# Patient Record
Sex: Female | Born: 1935 | Race: White | Hispanic: No | Marital: Single
Health system: Southern US, Community
[De-identification: ages and names within clinical notes are randomized; demographics above are authoritative.]

## PROBLEM LIST (undated history)

## (undated) DIAGNOSIS — F70 Mild intellectual disabilities: Secondary | ICD-10-CM

## (undated) DIAGNOSIS — I10 Essential (primary) hypertension: Secondary | ICD-10-CM

## (undated) DIAGNOSIS — R569 Unspecified convulsions: Secondary | ICD-10-CM

## (undated) DIAGNOSIS — Z950 Presence of cardiac pacemaker: Secondary | ICD-10-CM

## (undated) DIAGNOSIS — I639 Cerebral infarction, unspecified: Secondary | ICD-10-CM

## (undated) DIAGNOSIS — D649 Anemia, unspecified: Secondary | ICD-10-CM

## (undated) DIAGNOSIS — F329 Major depressive disorder, single episode, unspecified: Secondary | ICD-10-CM

## (undated) DIAGNOSIS — F0151 Vascular dementia with behavioral disturbance: Secondary | ICD-10-CM

## (undated) DIAGNOSIS — K219 Gastro-esophageal reflux disease without esophagitis: Secondary | ICD-10-CM

## (undated) DIAGNOSIS — F0391 Unspecified dementia with behavioral disturbance: Secondary | ICD-10-CM

## (undated) DIAGNOSIS — E559 Vitamin D deficiency, unspecified: Secondary | ICD-10-CM

## (undated) DIAGNOSIS — K5900 Constipation, unspecified: Secondary | ICD-10-CM

## (undated) DIAGNOSIS — M810 Age-related osteoporosis without current pathological fracture: Secondary | ICD-10-CM

## (undated) DIAGNOSIS — R488 Other symbolic dysfunctions: Secondary | ICD-10-CM

## (undated) DIAGNOSIS — I509 Heart failure, unspecified: Secondary | ICD-10-CM

## (undated) DIAGNOSIS — I2510 Atherosclerotic heart disease of native coronary artery without angina pectoris: Secondary | ICD-10-CM

## (undated) DIAGNOSIS — F01518 Vascular dementia, unspecified severity, with other behavioral disturbance: Secondary | ICD-10-CM

## (undated) DIAGNOSIS — J449 Chronic obstructive pulmonary disease, unspecified: Secondary | ICD-10-CM

## (undated) DIAGNOSIS — R1310 Dysphagia, unspecified: Secondary | ICD-10-CM

## (undated) DIAGNOSIS — G8323 Monoplegia of upper limb affecting right nondominant side: Secondary | ICD-10-CM

## (undated) DIAGNOSIS — D500 Iron deficiency anemia secondary to blood loss (chronic): Secondary | ICD-10-CM

## (undated) DIAGNOSIS — G4700 Insomnia, unspecified: Secondary | ICD-10-CM

## (undated) DIAGNOSIS — H409 Unspecified glaucoma: Secondary | ICD-10-CM

## (undated) DIAGNOSIS — I4891 Unspecified atrial fibrillation: Secondary | ICD-10-CM

## (undated) DIAGNOSIS — I495 Sick sinus syndrome: Secondary | ICD-10-CM

## (undated) DIAGNOSIS — R609 Edema, unspecified: Secondary | ICD-10-CM

## (undated) DIAGNOSIS — F03918 Unspecified dementia, unspecified severity, with other behavioral disturbance: Secondary | ICD-10-CM

## (undated) DIAGNOSIS — G894 Chronic pain syndrome: Secondary | ICD-10-CM

## (undated) DIAGNOSIS — I213 ST elevation (STEMI) myocardial infarction of unspecified site: Secondary | ICD-10-CM

## (undated) DIAGNOSIS — J189 Pneumonia, unspecified organism: Secondary | ICD-10-CM

## (undated) DIAGNOSIS — E440 Moderate protein-calorie malnutrition: Secondary | ICD-10-CM

## (undated) DIAGNOSIS — F32A Depression, unspecified: Secondary | ICD-10-CM

## (undated) HISTORY — PX: CHOLECYSTECTOMY: SHX55

---

## 2007-12-17 ENCOUNTER — Inpatient Hospital Stay (HOSPITAL_COMMUNITY): Admission: EM | Admit: 2007-12-17 | Discharge: 2007-12-21 | Payer: Self-pay | Admitting: Emergency Medicine

## 2007-12-17 ENCOUNTER — Ambulatory Visit: Payer: Self-pay | Admitting: Internal Medicine

## 2007-12-18 ENCOUNTER — Encounter (INDEPENDENT_AMBULATORY_CARE_PROVIDER_SITE_OTHER): Payer: Self-pay | Admitting: Family Medicine

## 2007-12-25 ENCOUNTER — Emergency Department (HOSPITAL_COMMUNITY): Admission: EM | Admit: 2007-12-25 | Discharge: 2007-12-25 | Payer: Self-pay | Admitting: Emergency Medicine

## 2011-02-08 NOTE — Discharge Summary (Signed)
NAMECORIANN, BROUHARD             ACCOUNT NO.:  [id number]   MEDICAL RECORD NO.:  [id number]          PATIENT TYPE:  INP   LOCATION:  A210                          FACILITY:  [facility]   PHYSICIAN:  Osvaldo Shipper, MD     DATE OF BIRTH:  [birth year]   DATE OF ADMISSION:  12/17/2007  DATE OF DISCHARGE:  03/27/2009LH                               DISCHARGE SUMMARY   Please review H&P dictated by Dr. Lilian Kapur for details regarding the  patient's presenting illness.   DISCHARGE DIAGNOSES:  1. Bradycardia secondary to her medications, resolved.  2. Poorly controlled hypertension.  3. History of aortic stenosis, dyslipidemia all stable.   BRIEF HOSPITAL COURSE:  Briefly, this 77 year old Caucasian female who  lives in a rest home/group home, who presented to the ED because of slow  heart rate.  She was apparently found on the floor and had a syncopal  episode.  She was found to have a pulse of 20.  She was given atropine  by EMS and she responded to the 40-50s.  The patient became more awake  and alert.  She was brought into the ED where she was evaluated and it  was decided to admit her.  The patient was noted to be on metoprolol and  clonidine which was thought to be the reason for her significant  bradycardia.  She was taken off of these.  Her heart rate slowly  responded and then she went into sinus tachycardia.  Her heart rate now  is running between 75-100 sinus rhythm.  However, her blood pressure has  come up a little bit.  With the re-initiation of metoprolol this has  improved.  Hopefully with re-initiation of some of her home blood  pressure pills, her blood pressure will continue to improve.  One of the  reasons for her high blood pressure could be rebound hypertension from  clonidine.  Cardiology was consulted who recommended discontinuing the  clonidine for now.   Her other medical issues pretty much remained stable.  The patient had  some nausea as well during this admission.   She was given symptomatic  treatment.  X-rays of the abdomen were done which showed possible mild  ileus.  But since then, she has been tolerating a diet and she has had a  couple of bowel movements.  Hence, I do not think she needs further  evaluation at this point.   She also had other studies including a renal ultrasound which showed  incomplete visualization of the left kidney, incidental gallstones were  noted.  No hydronephrosis was seen.  Chest x-rays showed cardiac  enlargement with possible mild edema.  Repeat x-ray did not show this  finding.   On the day of discharge, the patient is feeling much better.  She has  ambulated.  She feels that she can go back home.  Telemetry shows heart  rate in the 100-110 range all through the night.  She was in the 70s.  Her blood pressure is 174/83 this morning.  Overnight, it was 138/73.  Saturation 97% on room air.  Examination otherwise was unremarkable.  She does have a slight elevated blood pressure, but I think she does not  have any symptoms associated, so I think she can be discharged.  We will  readjust some of her medications as will be stated below.   DISCHARGE MEDICATIONS:  1. Norvasc 5 mg p.o. b.i.d.  2. Benazepril 40 mg b.i.d.  3. Metoprolol 100 mg p.o. b.i.d.  4. HCTZ 25 mg once daily.  5. Vitamin D 400 units 1-1/2 tablets every day.  6. Acetaminophen 500 mg b.i.d.  7. Actonel 35 mg once a week.  8. Nabumetone 750 mg b.i.d.  9. Lipitor 40 mg at bedtime.  10.Ranitidine 150 mg b.i.d.  11.Paroxetine 10 mg every day.  12.Calcium carbonate 600 mg b.i.d.  13.Carbamazepine 200 mg 4 times a day.  14.Fish oil 1 gm every day.  15.Doxazosin 4 mg at bedtime.  16.Tramadol 50 mg every 4 hours as needed for pain.  17.Loperamide 2 mg 3 times daily as needed for diarrhea.  18.Stool softener 100 mg b.i.d. as needed for constipation.   DISCHARGE INSTRUCTIONS:  1. She will need to follow up with her PMD, I believe is located in       [facility] in 1 weeks' time, the group home to please      arrange this.  2. She will need a basic metabolic profile blood work in 1 weeks' time      as well to look at her kidney function.  3. I will encourage the group home to check her blood pressure on a      daily basis and call her PMD if it is staying elevated.  4. I have instructed the patient to consume fluids generously for the      next 1 week.  5. Her labs from today did show elevated BUN and creatinine, but she      is making urine, so I am not overly concerned about this.  Repeat      BMET in a week's time should be enough evaluation and follow up for      this at this point.  6. Imaging studies have been discussed above.   CONSULTATIONS:  Obtained from [facility].      Osvaldo Shipper, MD  Electronically Signed     GK/MEDQ  D:  12/21/2007  T:  12/21/2007  Job:  [id number]

## 2011-02-08 NOTE — Consult Note (Signed)
[name], [name]             ACCOUNT NO.:  [id number]   MEDICAL RECORD NO.:  [id number]          PATIENT TYPE:  INP   LOCATION:  A210                          FACILITY:  [facility]   PHYSICIAN:  Pricilla Riffle, MD, FACCDATE OF BIRTH:  21-May-1931   DATE OF CONSULTATION:  12/17/2007  DATE OF DISCHARGE:                                 CONSULTATION   IDENTIFICATION:  The patient is a 77 year old we are asked to see for  evaluation of bradycardia.   HISTORY OF PRESENT ILLNESS:  The patient lives in a family resident home  per her report and some of the ER note.  Yesterday she complained of  abdominal pain that was periumbilical.  She went to bed.  She says she  took the covers on and off.  Felt hot at times. Woke up. got up this  morning actually, and again her abdominal pain came back.  She went back  to bed.  Had maybe some vomiting, got up, felt like a seizure was going  to happen.  Next thing she could not balance, and she was on the floor.  She thinks she might have passed out for a minute but very unclear.  Denied any associated palpitations.  EMS found her as they were called  by other residents.  Her heart rate was noted to be 20 at the time.  One  amp of atropine was given, and her heart rate increased.  She was  transported to [facility] ER.  She denies chest pain.  Breathing is  fair.   ALLERGIES:  None.   MEDICATIONS ON ADMISSION:  1. Actonel 35.  2. Clonidine 0.3 b.i.d.  3. Nabumetone 750 b.i.d.  4. Benazepril 40 b.i.d.  5. Lipitor 40 q.h.s.  6. Ranitidine 150 b.i.d.  7. Paroxetine 10.  8. Metoprolol tartrate 200.  9. Hydrochlorothiazide 25.  10.Calcium carbonate 1200.  11.Carbamazepine 200.  12.Felodipine 10.  13.Darvocet p.r.n.  14.Doxazosin 4 q.h.s.  15.Tramadol.   PAST MEDICAL HISTORY:  1. Hypertension.  2. 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  3. History of aortic stenosis.  4. Dyslipidemia.  5. Question  seizures.  6. History of breast cancer status post mastectomy.   SOCIAL HISTORY:  Does not smoke, does not drink.  Lives in family home.   FAMILY HISTORY:  Significant for hypertension.   REVIEW OF SYSTEMS:  History of UTI, history of occasional diarrhea,  constipation, history of fevers, no chills, history of nonproductive  cough.  Otherwise, all systems reviewed and negative to the above  problem except as noted above.   PHYSICAL EXAMINATION:  GENERAL:  The patient is in no acute distress at  present.  VITAL SIGNS:  Blood pressure 140-189 over 72-89, pulses in the 40's to  50's, sinus bradycardia, O2 sat on room air 99%, respiratory rate 18,  temperature 98.  HEENT:  Normocephalic, atraumatic.  EOMI.  PERRL.  Slight lateral  nystagmus in the right, poor dentition.  NECK:  No JVD.  No bruits.  No thyromegaly.  LUNGS:  Clear without rales or wheezes.  SKIN:  Shows multiple nevi on the back.  CARDIAC:  Regular rate and rhythm. S1, S2.  Grade 2/6 systolic murmur  heard best at the base.  ABDOMEN:  Supple, nontender.  Normal bowel sounds.  No masses.  EXTREMITIES:  Right foot inverted, right arm paralyzed.  2+ pulses, no  edema.  NEUROLOGICAL:  Unable to move right arm, left leg is weak.  Cranial  nerves II-XII grossly intact.  Otherwise, deferred.   LABORATORY DATA:  Labs significant for hemoglobin 11, WBC 11.1.  Troponin less than 0.05, MB less than 1.  BUN and creatinine of 19 and  1, potassium 3.6.  CA of 8.9.  BNP 3324.   Chest x-ray shows cardiomegaly, vascular congestion.  This was a  portable film.  A 12-lead EKG shows sinus rhythm 77 beats per minute,  incomplete right bundle branch block, slight ST depression  inferolaterally.  Telemetry as noted above.   IMPRESSION:  The patient is a 77 year old with hypertension found on  floor today, heart rate 20.  Responded to atropine.  EKG with  inferolateral ST changes.  Currently blood pressure is normal to high.  Heart rate  is in the 40's to 50's.  Exam shows no evidence of congestive  heart failure, murmur consistent with AS on exam, but does not appear to  be severe.  Abdomen is supple and nontender.  Note paralysis in the  right arm.   Note the patient is on significant negative chronotropes preadmission  with clonidine and metoprolol; not sure how long this has been like  this.   RECOMMENDATIONS:  Would continue telemetry.  Hold metoprolol and  clonidine and follow.  Change benazepril to 40 daily.  She is on 80 by  report b.i.d.  Add Maxzide and Norvasc to regimen.  Check CK-MB and  troponin and TSH.  Echocardiogram to evaluate LV and aortic valve.  Would get records from [facility].  Would follow.      Pricilla Riffle, MD, [facility]  Electronically Signed    PVR/MEDQ  D:  12/17/2007  T:  12/18/2007  Job:  [id number]

## 2011-02-08 NOTE — H&P (Signed)
[name], [name]             ACCOUNT NO.:  [id number]   MEDICAL RECORD NO.:  [id number]          PATIENT TYPE:  INP   LOCATION:  A210                          FACILITY:  [facility]   PHYSICIAN:  Skeet Latch, DO    DATE OF BIRTH:  09/07/1931   DATE OF ADMISSION:  12/17/2007  DATE OF DISCHARGE:  LH                              HISTORY & PHYSICAL   CHIEF COMPLAINT:  Bradycardia.   HISTORY OF PRESENT ILLNESS:  This is a 77 year old Caucasian female who  was apparently found on the floor after a non-witnessed apparently fall.  EMS was called.  Apparently the patient was found to have a pulse of 20.  She was given atropine and responded to the 40-50s.  The patient states  that she felt that her stomach felt queasy and she slid down to the  floor.  The patient does not admit to any previous episode like this  before.  The patient does state that she is hot at times and was having  some belly discomfort.  The patient states that she got up and felt like  she was going to have a seizure, and next thing she knew she was on the  floor.  That is where EMS found her with heart rate of 20, and she was  given atropine.   PAST MEDICAL HISTORY:  Includes hypertension, aortic stenosis.  Hyperlipidemia.   FAMILY HISTORY:  Hypertension.   SURGICAL:  Cholecystectomy.   SOCIAL HISTORY:  No history of smoking, alcohol, illicit drug use.   No known drug allergies.   HOME MEDICATIONS:  1. Vitamin D 400 units 1-1/2 tablet every day.  2. Acetaminophen 500 mg 2 twice a day.  3. Actonel 35 mg weekly.  4. Clonidine 0.3 mg twice a day.  5. N-Bumetone 750 mg once a day.  6. Benazepril 40 mg once a day.  7. Lipitor 40 mg once a day.  8. Ranitidine 150 mg twice a day.  9. Prozac 10 mg once a day.  10.Metoprolol 100 mg 2 tablets in the morning 1 at night.  11.Hydrochlorothiazide 25 mg daily.  12.Calcium carbonate 600 mg 2 every day.  13.Carbamazepine 200 mg 4 times a day.  14.Fish or 1 gram every  day.  15.Doxazosin 4 mg 1 at bedtime.  16.Tramadol/acetaminophen 37.5/325 mg 4 times daily.  17.Propoxyphene-N 100 one tablet twice a day p.r.n. pain.  18.Loperamide 2 mg 3 times a day as needed for diarrhea.  19.Stool softener 100 mg 1 tablet twice a day as needed for      constipation.   REVIEW OF SYSTEMS:  Constitutional:  Positive for weakness and weight  loss, appetite, fever or chills.  HEENT: Unremarkable.  Cardiovascular:  Bradycardia.  No chest pain.  Respiratory: No cough, dyspnea, wheezing.  GI:  Slight nausea and no vomiting, diarrhea.  Urinary no dysuria,  urgency, frequency.  Musculoskeletal:  No arthralgias, arthritis.   PHYSICAL EXAM:  VITAL SIGNS: Temperature is 97.6, pulse 60, respirations  18, blood pressure 152/83.  HEENT: Head is atraumatic, normocephalic.  Eyes PERRL.  EOMI.  NECK:  Soft, supple, nontender,  nondistended.  CARDIOVASCULAR:  Regular rate and rhythm.  Does have a systolic ejection  murmur noted at the apex.  LUNGS:  Clear to auscultation.  No rales, rhonchi or wheezing.  ABDOMEN: Soft, nontender, nondistended.  No rigidity or guarding.  EXTREMITIES: She has some edema noted.  No erythema.   Labs so far, blood cultures are negative.  BNP is 289, phosphorus 3.7,  magnesium 1.9, last troponin 0.06, CK-MB is 2.5, total creatinine kinase  is 52.  Sodium 135, potassium 3.0, chloride 95, CO2 is 33, glucose 97,  BUN 14, creatinine 0.91, PTT 28.  PT 13.4, INR 1.0.  White count is  8700, hemoglobin 0.4, hematocrit 32.9, platelets of 220,000.  Chest x-  ray shows cardiac enlargement with slight pulmonary vascular congestion  and questionable minimal edema.  Mild right basilar atelectasis   ASSESSMENT/PLAN:  Bradycardic episode.  Could be secondary to the  patient's metoprolol.  Will hold metoprolol at this time.  Cardiology  has been consulted and their evaluation is in progress at this time.  The patient on telemetry unit for closer monitoring.   PLAN:   For hyponatremia, resolved.  Continue to monitor closely for  this.  For anemia, will get anemia panel.  Her anemia is not severe at  this time.  Will hemoccult her stools to rule out any active bleeding.  For hypertension.  The patient will placed on home medications.  At this  time the patient is stable and I anticipate her being discharged in the  next 24 to 48 hours if she continues to improve.  The patient will  placed on DVT as well as GI prophylaxis.      Skeet Latch, DO  Electronically Signed     SM/MEDQ  D:  12/18/2007  T:  12/18/2007  Job:  [id number]

## 2011-02-08 NOTE — Group Therapy Note (Signed)
NAMEGRACEY, [name]             ACCOUNT NO.:  [id number]   MEDICAL RECORD NO.:  [id number]          PATIENT TYPE:  INP   LOCATION:  A210                          FACILITY:  [facility]   PHYSICIAN:  Skeet Latch, DO    DATE OF BIRTH:  1931/02/20   DATE OF PROCEDURE:  12/18/2007  DATE OF DISCHARGE:                                 PROGRESS NOTE   SUBJECTIVE:  Ms. Beecher is doing well.  She denies any abdominal pain  or chest pain.  She also denies any shortness of breath, nausea,  vomiting or diarrhea.  The patient is alert and talkative.  She is in no  acute distress.   OBJECTIVE:  VITAL SIGNS:  Temperature 97.6 degrees, pulse 60,  respirations 18, blood pressure 152/83.  CARDIOVASCULAR:  A regular rate and rhythm.  She has a systolic ejection  murmur at the apex.  LUNGS:  Clear to auscultation bilaterally.  No rales, rhonchi or  wheezing.  ABDOMEN:  Soft, nontender, nondistended.  EXTREMITIES:  Trace edema.   LABORATORY DATA:  Beta natriuretic peptide is 289.  Phosphorus 3.7,  magnesium 1.9, sodium 135, potassium 3, chloride 95, CO2 of 33, glucose  97, BUN 14, creatinine 0.91.  PTT 28, PT 13.4, INR 1.  White count 8700,  hemoglobin 11.4, hematocrit 32.9, platelets 220.   ASSESSMENT/PLAN:  1. Bradycardia:  The patient's metoprolol is on hold at this time.      She is being evaluated for current etiology.  2. History of congestive heart failure, questionable diastolic.  No      signs of severe failure at this time.  Will continue to monitor.  3. History of aortic stenosis.  4. History of hypertension.  Will continue her home medications.  5. History of gastroesophageal reflux disease.  Will continue her home      medications.  6. Hypokalemia:  Will replace orally and repeat.  Will continue to      monitor closely.      Skeet Latch, DO  Electronically Signed     SM/MEDQ  D:  12/18/2007  T:  12/18/2007  Job:  [id number]

## 2011-06-20 LAB — DIFFERENTIAL
Basophils Absolute: 0
Basophils Absolute: 0
Basophils Absolute: 0.1
Basophils Relative: 1
Basophils Relative: 1
Eosinophils Absolute: 0
Eosinophils Absolute: 0.1
Eosinophils Absolute: 0.1
Eosinophils Absolute: 0.1
Eosinophils Relative: 0
Eosinophils Relative: 1
Lymphocytes Relative: 18
Lymphocytes Relative: 21
Lymphocytes Relative: 9 — ABNORMAL LOW
Lymphs Abs: 1
Lymphs Abs: 1.7
Monocytes Absolute: 0.6
Monocytes Absolute: 0.9
Monocytes Relative: 9
Neutro Abs: 7.3
Neutrophils Relative %: 70
Neutrophils Relative %: 84 — ABNORMAL HIGH

## 2011-06-20 LAB — CBC
HCT: 32.9 — ABNORMAL LOW
HCT: 33 — ABNORMAL LOW
Hemoglobin: 11.4 — ABNORMAL LOW
Hemoglobin: 12.6
MCHC: 34.3
MCHC: 34.8
MCV: 91.8
MCV: 92.4
MCV: 93.2
Platelets: 204
Platelets: 220
Platelets: 242
Platelets: 270
RBC: 3.96
RDW: 13
RDW: 13
RDW: 13.3
WBC: 11.1 — ABNORMAL HIGH
WBC: 8.1

## 2011-06-20 LAB — BASIC METABOLIC PANEL
BUN: 14
BUN: 19
BUN: 21
BUN: 53 — ABNORMAL HIGH
CO2: 29
CO2: 32
Calcium: 9.5
Calcium: 9.6
Chloride: 97
Chloride: 98
Creatinine, Ser: 0.91
Creatinine, Ser: 0.95
Creatinine, Ser: 0.98
Creatinine, Ser: 1.07
GFR calc Af Amer: 49 — ABNORMAL LOW
GFR calc Af Amer: >60
GFR calc Af Amer: >60
GFR calc non Af Amer: 38 — ABNORMAL LOW
GFR calc non Af Amer: 50 — ABNORMAL LOW
GFR calc non Af Amer: 55 — ABNORMAL LOW
GFR calc non Af Amer: 58 — ABNORMAL LOW
GFR calc non Af Amer: >60
Glucose, Bld: 109 — ABNORMAL HIGH
Glucose, Bld: 141 — ABNORMAL HIGH
Glucose, Bld: 97
Potassium: 3 — ABNORMAL LOW
Potassium: 4.4
Potassium: 4.4
Potassium: 4.8
Sodium: 132 — ABNORMAL LOW

## 2011-06-20 LAB — POCT CARDIAC MARKERS
CKMB, poc: 1
Operator id: 213931
Troponin i, poc: <0.05
Troponin i, poc: <0.05

## 2011-06-20 LAB — IRON AND TIBC: Saturation Ratios: 13 — ABNORMAL LOW

## 2011-06-20 LAB — CARDIAC PANEL(CRET KIN+CKTOT+MB+TROPI)
CK, MB: 2.5
CK, MB: 3
Relative Index: INVALID
Relative Index: INVALID
Total CK: 55
Total CK: 56
Troponin I: 0.05
Troponin I: 0.06
Troponin I: 0.06

## 2011-06-20 LAB — CULTURE, BLOOD (ROUTINE X 2)
Report Status: 3292009
Report Status: 3292009

## 2011-06-20 LAB — LIPASE, BLOOD: Lipase: 45

## 2011-06-20 LAB — PROTIME-INR: INR: 1

## 2011-06-20 LAB — LIPID PANEL
Cholesterol: 148
LDL Cholesterol: 70
Total CHOL/HDL Ratio: 2.8
Triglycerides: 126
VLDL: 25

## 2011-06-20 LAB — RETICULOCYTES
RBC.: 3.56 — ABNORMAL LOW
Retic Count, Absolute: 39.2
Retic Ct Pct: 1.1

## 2011-06-20 LAB — CARBAMAZEPINE LEVEL, TOTAL: Carbamazepine Lvl: 10.4

## 2011-06-20 LAB — HEPATIC FUNCTION PANEL
Alkaline Phosphatase: 56
Indirect Bilirubin: 0.6
Total Bilirubin: 0.7
Total Protein: 7.6

## 2011-06-20 LAB — VITAMIN B12: Vitamin B-12: 450 (ref 211–911)

## 2011-06-20 LAB — B-NATRIURETIC PEPTIDE (CONVERTED LAB): Pro B Natriuretic peptide (BNP): 324 — ABNORMAL HIGH

## 2011-06-20 LAB — MAGNESIUM: Magnesium: 1.9

## 2011-06-20 LAB — FOLATE: Folate: 14.3

## 2020-04-20 ENCOUNTER — Encounter (HOSPITAL_COMMUNITY): Payer: Self-pay | Admitting: Emergency Medicine

## 2020-04-20 ENCOUNTER — Other Ambulatory Visit: Payer: Self-pay

## 2020-04-20 ENCOUNTER — Emergency Department (HOSPITAL_COMMUNITY)
Admission: EM | Admit: 2020-04-20 | Discharge: 2020-04-20 | Disposition: A | Discharge status: Skilled Nursing Facility | Payer: Medicare Other | Attending: Emergency Medicine | Admitting: Emergency Medicine

## 2020-04-20 ENCOUNTER — Emergency Department (HOSPITAL_COMMUNITY): Payer: Medicare Other

## 2020-04-20 DIAGNOSIS — I2510 Atherosclerotic heart disease of native coronary artery without angina pectoris: Secondary | ICD-10-CM | POA: Insufficient documentation

## 2020-04-20 DIAGNOSIS — Z8673 Personal history of transient ischemic attack (TIA), and cerebral infarction without residual deficits: Secondary | ICD-10-CM | POA: Diagnosis not present

## 2020-04-20 DIAGNOSIS — F0391 Unspecified dementia with behavioral disturbance: Secondary | ICD-10-CM | POA: Insufficient documentation

## 2020-04-20 DIAGNOSIS — I509 Heart failure, unspecified: Secondary | ICD-10-CM | POA: Insufficient documentation

## 2020-04-20 DIAGNOSIS — I110 Hypertensive heart disease with heart failure: Secondary | ICD-10-CM | POA: Insufficient documentation

## 2020-04-20 DIAGNOSIS — I4891 Unspecified atrial fibrillation: Secondary | ICD-10-CM | POA: Diagnosis not present

## 2020-04-20 DIAGNOSIS — J690 Pneumonitis due to inhalation of food and vomit: Secondary | ICD-10-CM | POA: Insufficient documentation

## 2020-04-20 DIAGNOSIS — Z20822 Contact with and (suspected) exposure to covid-19: Secondary | ICD-10-CM | POA: Diagnosis not present

## 2020-04-20 HISTORY — DX: Iron deficiency anemia secondary to blood loss (chronic): D50.0

## 2020-04-20 HISTORY — DX: Sick sinus syndrome: I49.5

## 2020-04-20 HISTORY — DX: Anemia, unspecified: D64.9

## 2020-04-20 HISTORY — DX: Chronic obstructive pulmonary disease, unspecified: J44.9

## 2020-04-20 HISTORY — DX: Constipation, unspecified: K59.00

## 2020-04-20 HISTORY — DX: Presence of cardiac pacemaker: Z95.0

## 2020-04-20 HISTORY — DX: Unspecified convulsions: R56.9

## 2020-04-20 HISTORY — DX: Vitamin D deficiency, unspecified: E55.9

## 2020-04-20 HISTORY — DX: Vascular dementia, unspecified severity, with other behavioral disturbance: F01.518

## 2020-04-20 HISTORY — DX: Moderate protein-calorie malnutrition: E44.0

## 2020-04-20 HISTORY — DX: Edema, unspecified: R60.9

## 2020-04-20 HISTORY — DX: Major depressive disorder, single episode, unspecified: F32.9

## 2020-04-20 HISTORY — DX: Unspecified dementia with behavioral disturbance: F03.91

## 2020-04-20 HISTORY — DX: Vascular dementia with behavioral disturbance: F01.51

## 2020-04-20 HISTORY — DX: Monoplegia of upper limb affecting right nondominant side: G83.23

## 2020-04-20 HISTORY — DX: Unspecified glaucoma: H40.9

## 2020-04-20 HISTORY — DX: Gastro-esophageal reflux disease without esophagitis: K21.9

## 2020-04-20 HISTORY — DX: Essential (primary) hypertension: I10

## 2020-04-20 HISTORY — DX: Insomnia, unspecified: G47.00

## 2020-04-20 HISTORY — DX: Depression, unspecified: F32.A

## 2020-04-20 HISTORY — DX: Unspecified dementia, unspecified severity, with other behavioral disturbance: F03.918

## 2020-04-20 HISTORY — DX: Unspecified atrial fibrillation: I48.91

## 2020-04-20 HISTORY — DX: Mild intellectual disabilities: F70

## 2020-04-20 HISTORY — DX: Cerebral infarction, unspecified: I63.9

## 2020-04-20 HISTORY — DX: Age-related osteoporosis without current pathological fracture: M81.0

## 2020-04-20 HISTORY — DX: Heart failure, unspecified: I50.9

## 2020-04-20 HISTORY — DX: Other symbolic dysfunctions: R48.8

## 2020-04-20 HISTORY — DX: Atherosclerotic heart disease of native coronary artery without angina pectoris: I25.10

## 2020-04-20 HISTORY — DX: ST elevation (STEMI) myocardial infarction of unspecified site: I21.3

## 2020-04-20 HISTORY — DX: Dysphagia, unspecified: R13.10

## 2020-04-20 HISTORY — DX: Chronic pain syndrome: G89.4

## 2020-04-20 HISTORY — DX: Pneumonia, unspecified organism: J18.9

## 2020-04-20 LAB — CBC WITH DIFFERENTIAL/PLATELET
Abs Immature Granulocytes: 0.07 10*3/uL (ref 0.00–0.07)
Basophils Absolute: 0.1 10*3/uL (ref 0.0–0.1)
Basophils Relative: 1 %
Eosinophils Absolute: 0 10*3/uL (ref 0.0–0.5)
Eosinophils Relative: 0 %
HCT: 39.3 % (ref 36.0–46.0)
Hemoglobin: 11.9 g/dL — ABNORMAL LOW (ref 12.0–15.0)
Immature Granulocytes: 1 %
Lymphocytes Relative: 10 %
Lymphs Abs: 1 10*3/uL (ref 0.7–4.0)
MCH: 30 pg (ref 26.0–34.0)
MCHC: 30.3 g/dL (ref 30.0–36.0)
MCV: 99 fL (ref 80.0–100.0)
Monocytes Absolute: 0.8 10*3/uL (ref 0.1–1.0)
Monocytes Relative: 7 %
Neutro Abs: 8.3 10*3/uL — ABNORMAL HIGH (ref 1.7–7.7)
Neutrophils Relative %: 81 %
Platelets: 293 10*3/uL (ref 150–400)
RBC: 3.97 MIL/uL (ref 3.87–5.11)
RDW: 15.9 % — ABNORMAL HIGH (ref 11.5–15.5)
WBC: 10.2 10*3/uL (ref 4.0–10.5)
nRBC: 0 % (ref 0.0–0.2)

## 2020-04-20 LAB — SARS CORONAVIRUS 2 BY RT PCR (HOSPITAL ORDER, PERFORMED IN ~~LOC~~ HOSPITAL LAB): SARS Coronavirus 2: NEGATIVE

## 2020-04-20 LAB — BASIC METABOLIC PANEL
Anion gap: 11 (ref 5–15)
BUN: 23 mg/dL (ref 8–23)
CO2: 21 mmol/L — ABNORMAL LOW (ref 22–32)
Calcium: 9 mg/dL (ref 8.9–10.3)
Chloride: 100 mmol/L (ref 98–111)
Creatinine, Ser: 0.56 mg/dL (ref 0.44–1.00)
GFR calc Af Amer: >60 mL/min (ref >60)
GFR calc non Af Amer: >60 mL/min (ref >60)
Glucose, Bld: 107 mg/dL — ABNORMAL HIGH (ref 70–99)
Potassium: 4.7 mmol/L (ref 3.5–5.1)
Sodium: 132 mmol/L — ABNORMAL LOW (ref 135–145)

## 2020-04-20 MED ORDER — CLINDAMYCIN PALMITATE HCL 75 MG/5ML PO SOLR: 300.0000 mg | Freq: Three times a day (TID) | ORAL | 0 refills | Status: AC

## 2020-04-20 NOTE — ED Triage Notes (Signed)
Pt brought in from [facility] for possible aspiration from PEG tube. Per facility this has been going on for about 1 week. Pt is DNR at facility but they did not send her paperwork.

## 2020-04-20 NOTE — ED Provider Notes (Signed)
[facility] EMERGENCY DEPARTMENT Provider Note   CSN: [id number] Arrival date & time: 04/20/20  0002     History Chief Complaint  Patient presents with  . Aspiration  Level 5 caveat due to altered mental status  [name] is a 86 y.o. female.  HPI    Patient with extensive medical history including atrial fibrillation, CHF, chronic pain, COPD, dementia, previous stroke presents with concern for aspiration pneumonia.  Patient resides at a local nursing facility.  Patient has a PEG tube in place and gets continuous feeding.  She has had a previous history of aspiration pneumonia and is currently on Rocephin and clindamycin at the facility.  Tonight the patient began vomiting during a feeding, and it was concerned that she aspirated.  No other acute issues per nursing staff. Past Medical History:  Diagnosis Date  . Anemia   . Atrial fibrillation (HCC)   . CHF (congestive heart failure) (HCC)   . Chronic pain syndrome   . Constipation   . Convulsions (HCC)   . COPD (chronic obstructive pulmonary disease) (HCC)   . Coronary artery disease   . Dementia with behavioral disturbance (HCC)   . Depressive disorder   . Dysphagia   . Edema   . GERD (gastroesophageal reflux disease)   . Glaucoma   . Hypertension   . Insomnia   . Iron deficiency anemia secondary to blood loss (chronic)   . Mild intellectual disabilities   . 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 Osteoporosis   . Other symbolic dysfunctions   . Pneumonia   . Presence of cardiac pacemaker   . Protein-calorie malnutrition, moderate (HCC)   . Seizures (HCC)   . Sick sinus syndrome (HCC)   . ST elevation (STEMI) myocardial infarction (HCC)   . Stroke (HCC)   . Vascular dementia with behavioral disturbance (HCC)   . Vitamin D deficiency        OB History   No obstetric history on file.     No family history on file.  Social History   Tobacco Use  . Smoking  status: Not on file  Substance Use Topics  . Alcohol use: Not on file  . Drug use: Not on file    Home Medications Prior to Admission medications   Not on File    Allergies    Patient has no known allergies.  Review of Systems   Review of Systems  Unable to perform ROS: Mental status change    Physical Exam Updated Vital Signs BP (!) 181/93   Pulse 63   Temp 98.8 F (37.1 C) (Oral)   Resp 20   Ht 1.626 m (5\' 4" )   Wt 90.7 kg   SpO2 93%   BMI 34.33 kg/m   Physical Exam CONSTITUTIONAL: Chronically ill-appearing HEAD: Normocephalic/atraumatic EYES: EOMI ENMT: Poor dentition NECK: supple no meningeal signs CV: S1/S2 noted LUNGS: Coarse breath sounds bilaterally crackles noted, no apparent distress ABDOMEN: soft, nontender, PEG tube in place NEURO: Pt is awake/alert but nonverbal EXTREMITIES: pulses normal/equal, full ROM, edema noted. SKIN: warm, color normal PSYCH: Unable to assess  ED Results / Procedures / Treatments   Labs (all labs ordered are listed, but only abnormal results are displayed) Labs Reviewed  BASIC METABOLIC PANEL - Abnormal; Notable for the following components:      Result Value   Sodium 132 (*)    CO2 21 (*)    Glucose, Bld 107 (*)  All other components within normal limits  CBC WITH DIFFERENTIAL/PLATELET - Abnormal; Notable for the following components:   Hemoglobin 11.9 (*)    RDW 15.9 (*)    Neutro Abs 8.3 (*)    All other components within normal limits  SARS CORONAVIRUS 2 BY RT PCR (HOSPITAL ORDER, PERFORMED IN [facility])    EKG None  Radiology No results found.  Procedures Procedures Medications Ordered in ED Medications - No data to display  ED Course  I have reviewed the triage vital signs and the nursing notes.  Pertinent labs & imaging results that were available during my care of the patient were reviewed by me and considered in my medical decision making (see chart for details).    MDM  Rules/Calculators/A&P                          12:57 AM Patient presents for concern for aspiration pneumonia.  Current room air pulse ox is around 89%.  Clinically patient likely has aspiration pneumonia.  Discussed the case with nursing supervisor at the [facility].  She reports patient is supposed to stop antibiotics for aspiration today.  Patient does have a DNR in place.  Her MOST form is also enacted and is only requesting IV fluids and antibiotics for a defined period of time.  Will obtain chest x-ray and labs and reassess. 3:57 AM Pt stable No distress noted Labs reassuring Nursing staff the facility had indicated they are looking to change her tube feedings to help prevent further aspiration Orders have been given to continue clindamycin daily as well as placing her on oxygen.  Patient stable for discharge Final Clinical Impression(s) / ED Diagnoses Final diagnoses:  Aspiration pneumonia, unspecified aspiration pneumonia type, unspecified laterality, unspecified part of lung (HCC)    Rx / DC Orders ED Discharge Orders         Ordered    clindamycin (CLEOCIN) 75 MG/5ML solution  3 times daily     Discontinue  Reprint     04/20/20 0335           Zadie Rhine, MD 04/20/20 [phone]

## 2020-04-20 NOTE — Discharge Instructions (Addendum)
Patient can be continued on clindamycin 3 times a day for a week.  She will need to have a chest x-ray in 1 to 2 weeks to see if the pneumonia is improved She will need to be placed on 2 L nasal cannula to help with her oxygenation

## 2020-06-03 ENCOUNTER — Encounter (HOSPITAL_COMMUNITY): Payer: Self-pay | Admitting: Emergency Medicine

## 2020-06-03 ENCOUNTER — Other Ambulatory Visit: Payer: Self-pay

## 2020-06-03 ENCOUNTER — Emergency Department (HOSPITAL_COMMUNITY): Payer: Medicare Other

## 2020-06-03 ENCOUNTER — Emergency Department (HOSPITAL_COMMUNITY)
Admission: EM | Admit: 2020-06-03 | Discharge: 2020-06-03 | Disposition: A | Discharge status: Home / Self Care | Payer: Medicare Other | Attending: Emergency Medicine | Admitting: Emergency Medicine

## 2020-06-03 DIAGNOSIS — J449 Chronic obstructive pulmonary disease, unspecified: Secondary | ICD-10-CM | POA: Diagnosis not present

## 2020-06-03 DIAGNOSIS — I509 Heart failure, unspecified: Secondary | ICD-10-CM | POA: Diagnosis not present

## 2020-06-03 DIAGNOSIS — I110 Hypertensive heart disease with heart failure: Secondary | ICD-10-CM | POA: Diagnosis not present

## 2020-06-03 DIAGNOSIS — K9423 Gastrostomy malfunction: Secondary | ICD-10-CM | POA: Diagnosis not present

## 2020-06-03 MED ORDER — IOPAMIDOL (ISOVUE-300) INJECTION 61%
50.0000 mL | Freq: Once | INTRAVENOUS | Status: AC | PRN
  Administered 2020-06-03: 50 mL

## 2020-06-03 MED ORDER — IOHEXOL 180 MG/ML  SOLN: 50.0000 mL | Freq: Once | INTRAMUSCULAR | Status: DC | PRN

## 2020-06-03 NOTE — ED Triage Notes (Signed)
Pt arrives via [facility] from [facility] [place]. Staff found that pts G Tube was out, staff placed a foley cath in for transport.

## 2020-06-03 NOTE — ED Provider Notes (Signed)
Signed out by Dr Jacqulyn Bath to d/c to home when imaging resulted.   Imaging shows tube in stomach.   No new c/o.   Pt appears stable for d/c.      Cathren Laine, MD 06/03/20 [phone]

## 2020-06-03 NOTE — ED Provider Notes (Signed)
Emergency Department Provider Note   I have reviewed the triage vital signs and the nursing notes.   HISTORY  Chief Complaint G Tube Replacement   HPI [name] is a 86 y.o. female with PMH of CVA with G-tube for continuous feeds presents with dislodged g-tube found by staff this AM. Staff placed a 62F foley cath to hold the tract and called EMS for ED transport. Patient is DNR with a MOST form.   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   Past Surgical History:  Procedure Laterality Date   CHOLECYSTECTOMY      Allergies Patient has no known allergies.  History reviewed. No pertinent family history.  Social History Social History   Tobacco Use   Smoking status: Not on file  Substance Use Topics   Alcohol use: Not on file   Drug use: Not on file    Review of Systems  Level 5 caveat: Dementia    ____________________________________________   PHYSICAL EXAM:  VITAL SIGNS: ED Triage Vitals [06/03/20 0644]  Enc Vitals Group     BP      Pulse      Resp      Temp      Temp src      SpO2      Weight 200 lb 2.8 oz (90.8 kg)     Height 5\' 4"  (1.626 m)     Head Circumference      Peak Flow      Pain Score 0     Pain Loc      Pain Edu?      Excl. in GC?    Constitutional: Alert but confused. Well appearing and in no acute distress. Eyes: Conjunctivae are normal.  Head: Atraumatic. Nose: No congestion/rhinnorhea. Mouth/Throat: Mucous membranes are moist.  Neck: No stridor.  Cardiovascular: Normal rate, regular rhythm. Good peripheral circulation. Grossly normal heart sounds.   Respiratory: Normal respiratory effort.  No retractions. Lungs CTAB. Gastrointestinal: Soft and nontender. No distention. 62F foley cath in LUQ stoma.  Musculoskeletal: No gross deformities of extremities. Neurologic: Awake and alert.  Skin:  Skin is warm, dry and intact. No rash noted. ____________________________________________  RADIOLOGY  DG ABDOMEN PEG TUBE LOCATION  Result Date: 06/03/2020 CLINICAL DATA:  Peg tube replacement EXAM: ABDOMEN -  1 VIEW COMPARISON:  12/19/2007 FINDINGS: Peg at the left upper quadrant which was injected and opacifies the stomach. No evidence of luminal extravasation. The bowel gas pattern is unremarkable. Generalized osteopenia and advanced degenerative disease. Remote T11, L1, L3, and L4 compression fractures. Right hip arthroplasty. Cholecystectomy clips. IMPRESSION: Peg tube injection opacifies the stomach without extravasation. Electronically Signed   By: Marnee Spring M.D.   On: 06/03/2020 07:34    ____________________________________________   PROCEDURES  Procedure(s) performed:   Gastrostomy tube replacement  Date/Time: 06/03/2020 6:48 AM Performed by: Maia Plan, MD Authorized by: Maia Plan, MD  Preparation: Patient was prepped and draped  in the usual sterile fashion. Local anesthesia used: no  Anesthesia: Local anesthesia used: no  Sedation: Patient sedated: no  Patient tolerance: patient tolerated the procedure well with no immediate complications Comments: 21F PEG tube exchanged at the bedside. Foley catheter removed and PEG tube placed and advanced under gentle, steady pressure. The tube passed without significant resistance and gastric contents drawn back. Minimal bleeding and pain during procedure. No obvious pain while inflating balloon. 6 mL of air used to inflate balloon.       ____________________________________________   INITIAL IMPRESSION / ASSESSMENT AND PLAN / ED COURSE  Pertinent labs & imaging results that were available during my care of the patient were reviewed by me and considered in my medical decision making (see chart for details).   Patient presents to the ED with dislodged g-tube. Foley cath in place on arrival. G-tube replaced without difficulty. KUB with contrast ordered to confirm placement.   KUB pending. Care transferred to Dr. Denton Lank.  ____________________________________________  FINAL CLINICAL IMPRESSION(S) / ED DIAGNOSES  Final diagnoses:  PEG tube malfunction (HCC)    MEDICATIONS GIVEN DURING THIS VISIT:  Medications  iopamidol (ISOVUE-300) 61 % injection 50 mL (50 mLs Other Contrast Given 06/03/20 0723)    Note:  This document was prepared using Dragon voice recognition software and may include unintentional dictation errors.  Alona Bene, MD, [facility] Emergency Medicine    Travoris Bushey, Arlyss Repress, MD 06/03/20 [phone]

## 2020-06-03 NOTE — Discharge Instructions (Addendum)
It was our pleasure to provide your ER care today - we hope that you feel better.  Your peg tube was replaced.   Return to ER if worse, new symptoms, new or severe pain, persistent vomiting, fevers, or other concern.   Your blood pressure is high today - follow up with primary care doctor in the next 1-2 weeks.

## 2020-06-05 ENCOUNTER — Emergency Department (HOSPITAL_COMMUNITY)
Admission: EM | Admit: 2020-06-05 | Discharge: 2020-06-05 | Disposition: A | Discharge status: Home / Self Care | Payer: Medicare Other | Attending: Emergency Medicine | Admitting: Emergency Medicine

## 2020-06-05 ENCOUNTER — Emergency Department (HOSPITAL_COMMUNITY): Payer: Medicare Other

## 2020-06-05 ENCOUNTER — Encounter (HOSPITAL_COMMUNITY): Payer: Self-pay

## 2020-06-05 DIAGNOSIS — Z950 Presence of cardiac pacemaker: Secondary | ICD-10-CM | POA: Diagnosis not present

## 2020-06-05 DIAGNOSIS — Y658 Other specified misadventures during surgical and medical care: Secondary | ICD-10-CM | POA: Diagnosis not present

## 2020-06-05 DIAGNOSIS — I509 Heart failure, unspecified: Secondary | ICD-10-CM | POA: Diagnosis not present

## 2020-06-05 DIAGNOSIS — I110 Hypertensive heart disease with heart failure: Secondary | ICD-10-CM | POA: Diagnosis not present

## 2020-06-05 DIAGNOSIS — I2510 Atherosclerotic heart disease of native coronary artery without angina pectoris: Secondary | ICD-10-CM | POA: Diagnosis not present

## 2020-06-05 DIAGNOSIS — T85528A Displacement of other gastrointestinal prosthetic devices, implants and grafts, initial encounter: Secondary | ICD-10-CM

## 2020-06-05 DIAGNOSIS — J449 Chronic obstructive pulmonary disease, unspecified: Secondary | ICD-10-CM | POA: Insufficient documentation

## 2020-06-05 DIAGNOSIS — Z09 Encounter for follow-up examination after completed treatment for conditions other than malignant neoplasm: Secondary | ICD-10-CM

## 2020-06-05 DIAGNOSIS — Z431 Encounter for attention to gastrostomy: Secondary | ICD-10-CM | POA: Diagnosis not present

## 2020-06-05 MED ORDER — IOHEXOL 300 MG/ML  SOLN
50.0000 mL | Freq: Once | INTRAMUSCULAR | Status: AC | PRN
  Administered 2020-06-05: 50 mL

## 2020-06-05 NOTE — ED Provider Notes (Signed)
[facility] EMERGENCY DEPARTMENT Provider Note   CSN: [id number] Arrival date & time: 06/05/20  0932     History Chief Complaint  Patient presents with   Replace peg tube    [name] is a 86 y.o. female.  86 year old female sent by nursing facility for displaced peg tube, per notes, patient pulled her tube out this morning. Patient has history of CVA, is non verbal and unable to provide any history, level 5 caveat applies.         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     No family history on file.  Social History   Tobacco Use   Smoking status: Not on file  Substance Use Topics   Alcohol use: Not on file   Drug use: Not on file    Home Medications Prior to Admission medications   Medication Sig Start Date  End Date Taking? Authorizing Provider  clindamycin (CLEOCIN) 75 MG/5ML solution Take 20 mLs (300 mg total) by mouth 3 (three) times daily. 04/20/20   Zadie Rhine, MD    Allergies    Patient has no known allergies.  Review of Systems   Review of Systems  Unable to perform ROS: Patient nonverbal    Physical Exam Updated Vital Signs BP (!) 191/66 (BP Location: Left Arm)    Pulse 65    Temp 97.8 F (36.6 C) (Oral)    Resp 18    Ht 5\' 4"  (1.626 m)    Wt 90 kg    SpO2 100%    BMI 34.06 kg/m   Physical Exam Vitals and nursing note reviewed.  Constitutional:      General: She is not in acute distress.    Appearance: She is well-developed. She is not diaphoretic.  HENT:     Head: Normocephalic and atraumatic.  Cardiovascular:     Rate and Rhythm: Normal rate and regular rhythm.     Heart sounds: Normal heart sounds.  Pulmonary:     Effort: Pulmonary effort is normal.  Abdominal:     Palpations: Abdomen is soft.     Tenderness: There is no abdominal  tenderness.    Skin:    General: Skin is warm and dry.     Findings: No erythema or rash.  Neurological:     Mental Status: She is alert. Mental status is at baseline.     ED Results / Procedures / Treatments   Labs (all labs ordered are listed, but only abnormal results are displayed) Labs Reviewed - No data to display  EKG None  Radiology DG ABDOMEN PEG TUBE LOCATION  Result Date: 06/05/2020 CLINICAL DATA:  Peg tube placement. EXAM: ABDOMEN - 1 VIEW COMPARISON:  06/03/2020. FINDINGS: Peg tube noted with its tip projected over the stomach. Oral contrast in the stomach. No air noted the peg tube balloon as noted on prior study of 06/03/2020. Some the previously administered contrast is now noted the colon and rectum. No bowel distention. No free air. Cardiac pacer noted. Right hip replacement. Degenerative change thoracolumbar spine. Bilateral pulmonary interstitial prominence again noted. IMPRESSION: 1. Peg tube noted with  tip projected over the lower stomach. Peg tube balloon does not appear air-filled as noted on prior study of 06/03/2020. Clinical correlation suggested. 2. Some of the previously administered oral contrast is now noted throughout the colon rectum. No bowel distention. Electronically Signed   By: Maisie Fus  Register   On: 06/05/2020 11:54    Procedures Procedures (including critical care time)  Medications Ordered in ED Medications  iohexol (OMNIPAQUE) 300 MG/ML solution 50 mL (50 mLs Per Tube Contrast Given 06/05/20 1149)    ED Course  I have reviewed the triage vital signs and the nursing notes.  Pertinent labs & imaging results that were available during my care of the patient were reviewed by me and considered in my medical decision making (see chart for details).  Clinical Course as of Jun 05 1229  Fri Jun 05, 2020  [id number] 86 year old female brought in by EMS from nursing facility of displaced peg tube. Attempted to pass 18g and 20g foley without success, discussed with Dr. Estell Harpin, ER attending. Unable to locate any records on this patient regarding when/whom it was placed by. Consult with Dr. Lovell Sheehan with general surgery who is in the OR at this time and will see the patient.    [LM]  1042 Patient was seen by Dr. Lovell Sheehan who was able to place a 47F feeding tube at bedside. Plan is to order abdominal binder to prevent patient from pulling on her tube. Will also order imaging to confirm placement.    [LM]    Clinical Course User Index [LM] Alden Hipp   MDM Rules/Calculators/A&P                          Final Clinical Impression(s) / ED Diagnoses Final diagnoses:  Status post gastrostomy tube placement, follow-up exam    Rx / DC Orders ED Discharge Orders    None       Jeannie Fend, PA-C 06/05/20 1230    Bethann Berkshire, MD 06/06/20 1445

## 2020-06-05 NOTE — Discharge Instructions (Addendum)
Abdominal binder placed to prevent patient from pulling on her tube, does not need to be tight.

## 2020-06-05 NOTE — ED Notes (Signed)
Pt resting. Nad.

## 2020-06-05 NOTE — ED Notes (Signed)
Pt resting. nad

## 2020-06-05 NOTE — Consult Note (Signed)
Reason for Consult: PEG tube dislodgment Referring Physician: Dr. Derek Jack [name] is an 86 y.o. female.  HPI: Patient is an 86 year old white female who was transferred from a nursing home for PEG tube replacement.  She was previously seen in the emergency room 2 days ago for PEG tube replacement.  She was noted this morning to have dislodgment of the PEG tube.  In the emergency room, they were unable to replace the gastrostomy tube in the past surgery for consultation.  Patient is noncommunicative due to dementia.  Past Medical History:  Diagnosis Date  . Anemia   . Atrial fibrillation (HCC)   . CHF (congestive heart failure) (HCC)   . Chronic pain syndrome   . Constipation   . Convulsions (HCC)   . COPD (chronic obstructive pulmonary disease) (HCC)   . Coronary artery disease   . Dementia with behavioral disturbance (HCC)   . Depressive disorder   . Dysphagia   . Edema   . GERD (gastroesophageal reflux disease)   . Glaucoma   . Hypertension   . Insomnia   . Iron deficiency anemia secondary to blood loss (chronic)   . Mild intellectual disabilities   . 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 Osteoporosis   . Other symbolic dysfunctions   . Pneumonia   . Presence of cardiac pacemaker   . Protein-calorie malnutrition, moderate (HCC)   . Seizures (HCC)   . Sick sinus syndrome (HCC)   . ST elevation (STEMI) myocardial infarction (HCC)   . Stroke (HCC)   . Vascular dementia with behavioral disturbance (HCC)   . Vitamin D deficiency     Past Surgical History:  Procedure Laterality Date  . CHOLECYSTECTOMY      No family history on file.  Social History:  has no history on file for tobacco use, alcohol use, and drug use.  Allergies: No Known Allergies  Medications: I have reviewed the patient's current medications.  No results found for this or any previous visit (from the past 48 hour(s)).  No results found.  ROS:  Review  of systems not obtained due to patient factors.  Blood pressure (!) 191/66, pulse 65, temperature 97.8 F (36.6 C), temperature source Oral, resp. rate 18, height 5\' 4"  (1.626 m), weight 90 kg, SpO2 100 %.   Physical Exam: Pleasant noncombative white female no acute distress Abdomen: Small narrow opening noted in left upper quadrant of abdomen.  No active bleeding is noted.  Procedure note.  Unable to obtain consent due to patient dementia.  A hemostat was used to dilate the tract.  I first attempted an 64 French gastrostomy tube but was unable to place it.  Thus, a 20 French gastrostomy tube was placed without difficulty.  The balloon was instilled with 6 cc of air.  The bolster was placed at the 3 cm Arash Karstens.  Using a Toomey syringe, gastric contents were aspirated.  A Gastrografin study has been ordered.  The patient tolerated the procedure well.  Assessment/Plan: Impression: Dislodgment of PEG tube, replaced with a 16 French PEG tube. Plan: Would place abdominal binder to prevent patient from pulling PEG in the future.  May be discharged back to nursing home pending Gastrografin results.  12 06/05/2020, 11:32 AM

## 2020-06-05 NOTE — ED Notes (Signed)
Pt taken to xray 

## 2020-06-05 NOTE — ED Notes (Signed)
[name] working on d/c

## 2020-06-05 NOTE — ED Notes (Signed)
Dr. Jenkins at bedside. 

## 2020-06-05 NOTE — ED Notes (Signed)
Called for transport back to [facility] in [place].

## 2020-06-05 NOTE — ED Notes (Signed)
Per ems. This is pt baseline, noncomuncative. Pt pulled PEG tube out last night. nad

## 2020-06-05 NOTE — ED Notes (Signed)
Pt resting. Nad.  °

## 2020-06-05 NOTE — ED Triage Notes (Signed)
Pt in for replacement of feeding tube.

## 2020-10-27 DEATH — deceased

## 2021-01-26 IMAGING — DX DG ABDOMEN 1V
1 series · 1 of 1 positions shown · non-contrast
Comparison: 06/03/2020.

CLINICAL DATA: Peg tube placement.

EXAM:
ABDOMEN - 1 VIEW

[abdomen kub]
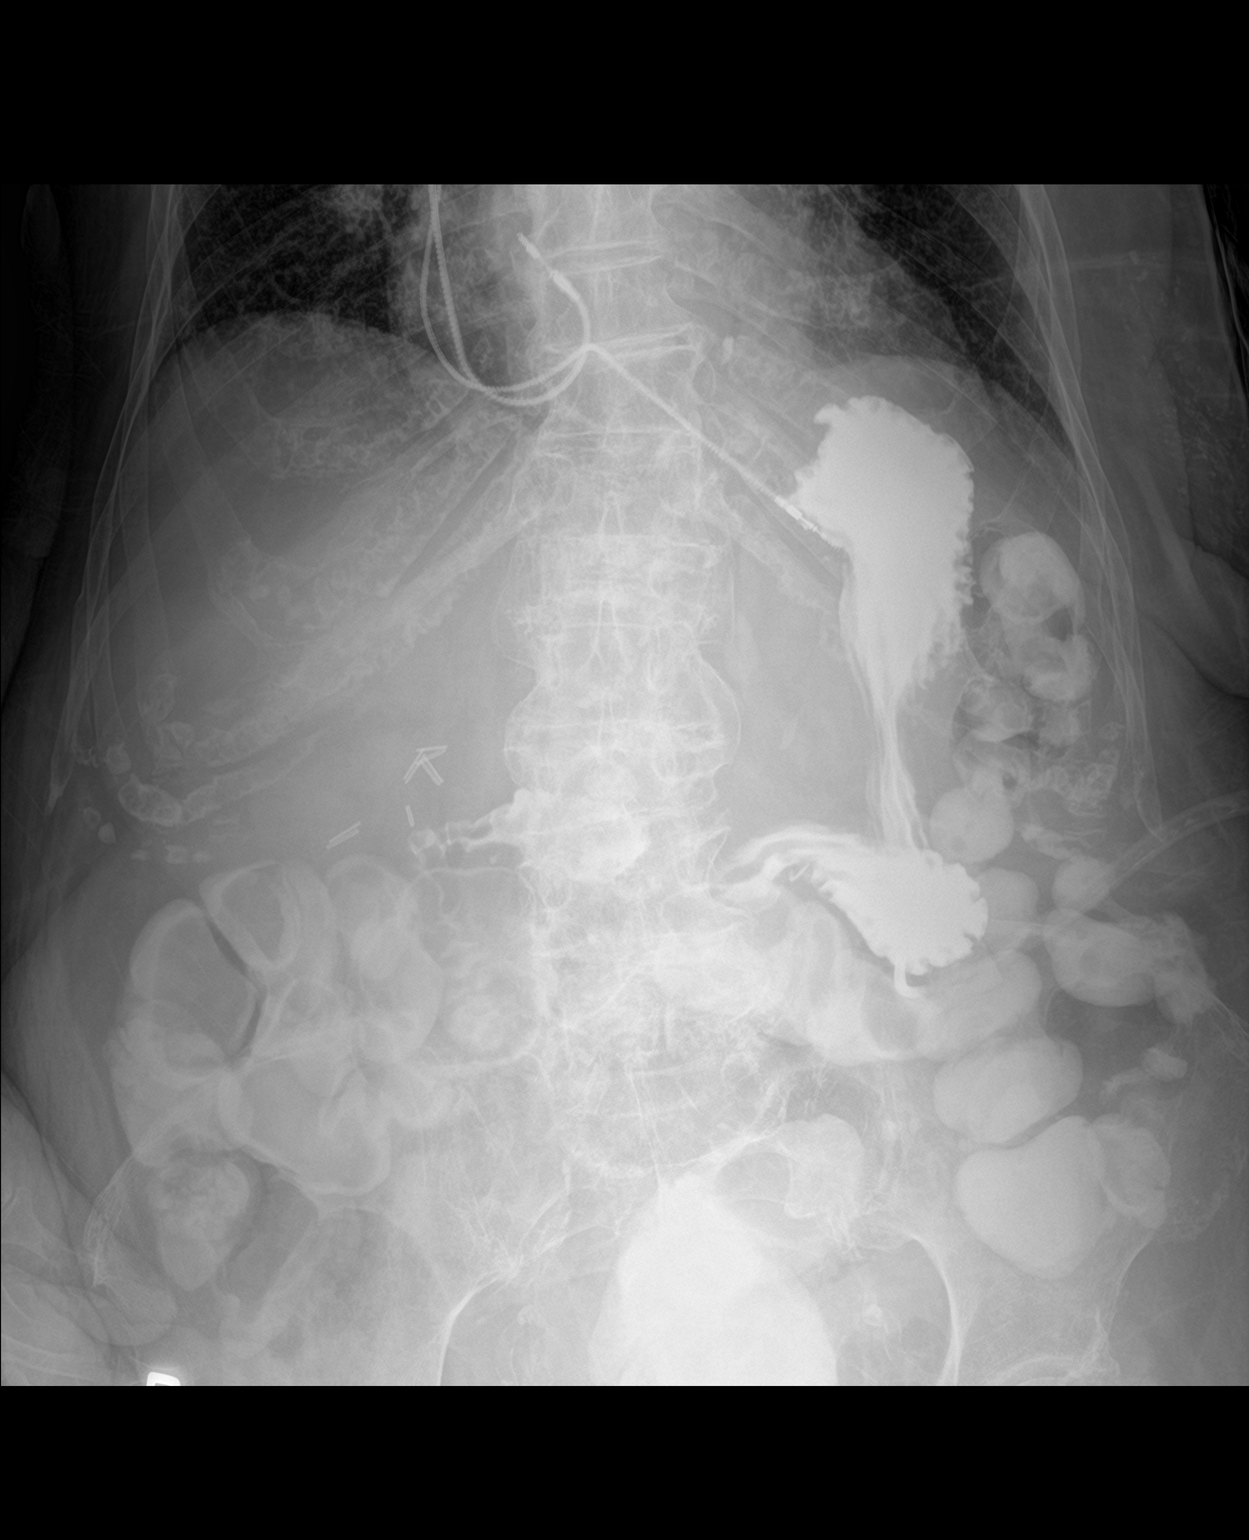

[1 of 1 positions shown; findings below may reference images not displayed]

FINDINGS: Peg tube noted with its tip projected over the stomach. Oral
contrast in the stomach. No air noted the peg tube balloon as noted
on prior study of 06/03/2020. Some the previously administered
contrast is now noted the colon and rectum. No bowel distention. No
free air. Cardiac pacer noted. Right hip replacement. Degenerative
change thoracolumbar spine. Bilateral pulmonary interstitial
prominence again noted.
IMPRESSION: 1. Peg tube noted with tip projected over the lower stomach. Peg
tube balloon does not appear air-filled as noted on prior study of
06/03/2020. Clinical correlation suggested.

2. Some of the previously administered oral contrast is now noted
throughout the colon rectum. No bowel distention.
